# Patient Record
Sex: Female | Born: 1980 | Race: White | Hispanic: No | Smoking: Never smoker
Health system: Southern US, Community
[De-identification: ages and names within clinical notes are randomized; demographics above are authoritative.]

---

## 2020-09-18 ENCOUNTER — Encounter (HOSPITAL_BASED_OUTPATIENT_CLINIC_OR_DEPARTMENT_OTHER): Payer: Self-pay | Admitting: *Deleted

## 2020-09-18 ENCOUNTER — Other Ambulatory Visit: Payer: Self-pay

## 2020-09-18 ENCOUNTER — Inpatient Hospital Stay (HOSPITAL_BASED_OUTPATIENT_CLINIC_OR_DEPARTMENT_OTHER)
Admission: EM | Admit: 2020-09-18 | Discharge: 2020-09-21 | DRG: 193 | Disposition: A | Discharge status: Home / Self Care | Payer: Medicaid Other | Attending: Internal Medicine | Admitting: Internal Medicine

## 2020-09-18 DIAGNOSIS — Z20822 Contact with and (suspected) exposure to covid-19: Secondary | ICD-10-CM | POA: Diagnosis present

## 2020-09-18 DIAGNOSIS — T380X5A Adverse effect of glucocorticoids and synthetic analogues, initial encounter: Secondary | ICD-10-CM | POA: Diagnosis present

## 2020-09-18 DIAGNOSIS — E669 Obesity, unspecified: Secondary | ICD-10-CM | POA: Diagnosis present

## 2020-09-18 DIAGNOSIS — Z6841 Body Mass Index (BMI) 40.0 and over, adult: Secondary | ICD-10-CM

## 2020-09-18 DIAGNOSIS — D72829 Elevated white blood cell count, unspecified: Secondary | ICD-10-CM | POA: Diagnosis present

## 2020-09-18 DIAGNOSIS — J9601 Acute respiratory failure with hypoxia: Secondary | ICD-10-CM | POA: Diagnosis present

## 2020-09-18 DIAGNOSIS — J189 Pneumonia, unspecified organism: Principal | ICD-10-CM | POA: Diagnosis present

## 2020-09-18 DIAGNOSIS — Z885 Allergy status to narcotic agent status: Secondary | ICD-10-CM

## 2020-09-18 DIAGNOSIS — Z91013 Allergy to seafood: Secondary | ICD-10-CM

## 2020-09-18 NOTE — ED Triage Notes (Signed)
C/o SOB x 2 days , covid symptoms x 1 month

## 2020-09-18 NOTE — ED Provider Notes (Addendum)
TIME SEEN: 11:59 PM  CHIEF COMPLAINT: Shortness of breath  HPI: Patient is a 39 year old female with history of obesity who presents to the emergency department with shortness of breath that has been worsening over the past month.  States about a month ago she had fevers, chills, cough, nausea, vomiting and diarrhea.  She had a negative Covid test at that time.  She reports she has not had any fever, vomiting or diarrhea in 3 weeks but has had progressively worsening shortness of breath with dry cough and wheezing.  Reports chest pressure the past 3 days.  No history of tobacco use, asthma, COPD.  No history of PE or DVT.  No lower extremity swelling or pain.  Does not wear oxygen chronically.  Patient is not vaccinated for COVID-19.  ROS: See HPI Constitutional: no fever  Eyes: no drainage  ENT: no runny nose   Cardiovascular:  no chest pain  Resp:  SOB  GI: no vomiting GU: no dysuria Integumentary: no rash  Allergy: no hives  Musculoskeletal: no leg swelling  Neurological: no slurred speech ROS otherwise negative  PAST MEDICAL HISTORY/PAST SURGICAL HISTORY:  History reviewed. No pertinent past medical history.  MEDICATIONS:  Prior to Admission medications   Not on File    ALLERGIES:  Allergies  Allergen Reactions  . Shellfish Allergy     SOCIAL HISTORY:  Social History   Tobacco Use  . Smoking status: Never Smoker  . Smokeless tobacco: Not on file  Substance Use Topics  . Alcohol use: Not Currently    FAMILY HISTORY: No family history on file.  EXAM: BP (!) 165/105   Pulse 95   Temp 98.3 F (36.8 C) (Oral)   Resp 18   Ht 5' (1.524 m)   Wt 105.2 kg   LMP 08/26/2020   SpO2 96%   BMI 45.31 kg/m  CONSTITUTIONAL: Alert and oriented and responds appropriately to questions. Well-appearing; well-nourished, obese HEAD: Normocephalic EYES: Conjunctivae clear, pupils appear equal, EOM appear intact ENT: normal nose; moist mucous membranes NECK: Supple, normal  ROM CARD: Regular and intermittently tachycardic; S1 and S2 appreciated; no murmurs, no clicks, no rubs, no gallops RESP: Patient is tachypneic.  She is speaking short sentences.  She has expiratory wheezes appreciated diffusely.  No inspiratory wheezing.  No rhonchi or rales.  Minimally diminished at her bases bilaterally. ABD/GI: Normal bowel sounds; non-distended; soft, non-tender, no rebound, no guarding, no peritoneal signs, no hepatosplenomegaly BACK:  The back appears normal EXT: Normal ROM in all joints; no deformity noted, no edema; no cyanosis, no calf tenderness or calf swelling SKIN: Normal color for age and race; warm; no rash on exposed skin NEURO: Moves all extremities equally PSYCH: The patient's mood and manner are appropriate.   MEDICAL DECISION MAKING: Patient here with shortness of breath.  Oxygen saturation 86% with ambulation.  She does not wear oxygen chronically.  She is tachypneic here and wheezing.  Differential includes bronchospasm, viral URI such as Covid or influenza, pneumonia, PE.  Will obtain labs, Covid test, CTA of the chest.  Will give albuterol inhaler for symptomatic relief.  EKG shows no ischemic abnormality.  ED PROGRESS: Patient's work-up shows normal labs other than mild leukocytosis.  Normal high-sensitivity troponin and BNP.  Normal lactate.  Negative Covid and flu test today.  CT angios shows no PE but does show areas concerning for multifocal pneumonia.  Will cover with antibiotics for community-acquired pneumonia.  Given new onset hypoxia, patient will need admission.  Will  discuss with hospitalist at [facility].  2:31 AM Discussed patient's case with hospitalist, Dr. Toniann Fail.  I have recommended admission and patient (and family if present) agree with this plan. Admitting physician will place admission orders.   I reviewed all nursing notes, vitals, pertinent previous records and reviewed/interpreted all EKGs, lab and urine results, imaging (as  available).  3:45 AM  Called to room as patient stated that she was feeling very hot.  She has received Rocephin and half of her azithromycin.  No signs of allergic reaction.  She is coughing and now wheezing more than previous and is tachypneic.  Sats are 97% on 2 L.  Speaking full sentences.  Will give albuterol, Atrovent nebulizer treatment now that we know she is Covid negative.  We will also give Solu-Medrol given she is wheezing and hypoxic.  Will give Tussionex for symptomatic relief.   EKG Interpretation  Date/Time:  Tuesday September 18 2020 23:52:53 EST Ventricular Rate:  86 PR Interval:    QRS Duration: 94 QT Interval:  361 QTC Calculation: 432 R Axis:   98 Text Interpretation: Sinus rhythm Borderline right axis deviation No old tracing to compare Confirmed by Yilin Weedon, Baxter Hire [id number]) on 09/18/2020 11:59:02 PM        CRITICAL CARE Performed by: Baxter Hire Karmah Potocki   Total critical care time: 45 minutes  Critical care time was exclusive of separately billable procedures and treating other patients.  Critical care was necessary to treat or prevent imminent or life-threatening deterioration.  Critical care was time spent personally by me on the following activities: development of treatment plan with patient and/or surrogate as well as nursing, discussions with consultants, evaluation of patient's response to treatment, examination of patient, obtaining history from patient or surrogate, ordering and performing treatments and interventions, ordering and review of laboratory studies, ordering and review of radiographic studies, pulse oximetry and re-evaluation of patient's condition.   Jasiah Buntin was evaluated in Emergency Department on 09/18/2020 for the symptoms described in the history of present illness. She was evaluated in the context of the global COVID-19 pandemic, which necessitated consideration that the patient might be at risk for infection with the SARS-CoV-2 virus that  causes COVID-19. Institutional protocols and algorithms that pertain to the evaluation of patients at risk for COVID-19 are in a state of rapid change based on information released by regulatory bodies including the CDC and federal and state organizations. These policies and algorithms were followed during the patient's care in the ED.      Shalane Florendo, Layla Maw, DO 09/19/20 0231    Ensley Blas, Layla Maw, DO 09/19/20 0346    Honorio Devol, Layla Maw, DO 09/19/20 [phone]

## 2020-09-18 NOTE — ED Notes (Signed)
86% pulse ox while amb , HR 122

## 2020-09-19 ENCOUNTER — Emergency Department (HOSPITAL_BASED_OUTPATIENT_CLINIC_OR_DEPARTMENT_OTHER): Payer: Medicaid Other

## 2020-09-19 ENCOUNTER — Encounter (HOSPITAL_BASED_OUTPATIENT_CLINIC_OR_DEPARTMENT_OTHER): Payer: Self-pay | Admitting: Radiology

## 2020-09-19 DIAGNOSIS — Z885 Allergy status to narcotic agent status: Secondary | ICD-10-CM | POA: Diagnosis not present

## 2020-09-19 DIAGNOSIS — Z91013 Allergy to seafood: Secondary | ICD-10-CM | POA: Diagnosis not present

## 2020-09-19 DIAGNOSIS — Z6841 Body Mass Index (BMI) 40.0 and over, adult: Secondary | ICD-10-CM | POA: Diagnosis not present

## 2020-09-19 DIAGNOSIS — J189 Pneumonia, unspecified organism: Secondary | ICD-10-CM | POA: Diagnosis present

## 2020-09-19 DIAGNOSIS — D72829 Elevated white blood cell count, unspecified: Secondary | ICD-10-CM | POA: Diagnosis present

## 2020-09-19 DIAGNOSIS — Z20822 Contact with and (suspected) exposure to covid-19: Secondary | ICD-10-CM | POA: Diagnosis present

## 2020-09-19 DIAGNOSIS — T380X5A Adverse effect of glucocorticoids and synthetic analogues, initial encounter: Secondary | ICD-10-CM | POA: Diagnosis present

## 2020-09-19 DIAGNOSIS — J9601 Acute respiratory failure with hypoxia: Secondary | ICD-10-CM | POA: Diagnosis not present

## 2020-09-19 DIAGNOSIS — E669 Obesity, unspecified: Secondary | ICD-10-CM | POA: Diagnosis present

## 2020-09-19 LAB — CBC WITH DIFFERENTIAL/PLATELET
Abs Immature Granulocytes: 0.04 10*3/uL (ref 0.00–0.07)
Basophils Absolute: 0.1 10*3/uL (ref 0.0–0.1)
Basophils Relative: 0 %
Eosinophils Absolute: 0.6 10*3/uL — ABNORMAL HIGH (ref 0.0–0.5)
Eosinophils Relative: 5 %
HCT: 38.8 % (ref 36.0–46.0)
Hemoglobin: 13.6 g/dL (ref 12.0–15.0)
Immature Granulocytes: 0 %
Lymphocytes Relative: 31 %
Lymphs Abs: 4 10*3/uL (ref 0.7–4.0)
MCH: 30 pg (ref 26.0–34.0)
MCHC: 35.1 g/dL (ref 30.0–36.0)
MCV: 85.7 fL (ref 80.0–100.0)
Monocytes Absolute: 0.7 10*3/uL (ref 0.1–1.0)
Monocytes Relative: 6 %
Neutro Abs: 7.5 10*3/uL (ref 1.7–7.7)
Neutrophils Relative %: 58 %
Platelets: 444 10*3/uL — ABNORMAL HIGH (ref 150–400)
RBC: 4.53 MIL/uL (ref 3.87–5.11)
RDW: 14 % (ref 11.5–15.5)
WBC: 12.9 10*3/uL — ABNORMAL HIGH (ref 4.0–10.5)
nRBC: 0 % (ref 0.0–0.2)

## 2020-09-19 LAB — RESP PANEL BY RT-PCR (FLU A&B, COVID) ARPGX2
Influenza A by PCR: NEGATIVE
Influenza A by PCR: NEGATIVE
Influenza B by PCR: NEGATIVE
Influenza B by PCR: NEGATIVE
SARS Coronavirus 2 by RT PCR: NEGATIVE
SARS Coronavirus 2 by RT PCR: NEGATIVE

## 2020-09-19 LAB — COMPREHENSIVE METABOLIC PANEL
ALT: 27 U/L (ref 0–44)
AST: 21 U/L (ref 15–41)
Albumin: 4.3 g/dL (ref 3.5–5.0)
Alkaline Phosphatase: 51 U/L (ref 38–126)
Anion gap: 9 (ref 5–15)
BUN: 12 mg/dL (ref 6–20)
CO2: 24 mmol/L (ref 22–32)
Calcium: 9.4 mg/dL (ref 8.9–10.3)
Chloride: 102 mmol/L (ref 98–111)
Creatinine, Ser: 0.75 mg/dL (ref 0.44–1.00)
GFR, Estimated: >60 mL/min (ref >60)
Glucose, Bld: 92 mg/dL (ref 70–99)
Potassium: 3.9 mmol/L (ref 3.5–5.1)
Sodium: 135 mmol/L (ref 135–145)
Total Bilirubin: 0.3 mg/dL (ref 0.3–1.2)
Total Protein: 7.4 g/dL (ref 6.5–8.1)

## 2020-09-19 LAB — CBC
HCT: 36.3 % (ref 36.0–46.0)
Hemoglobin: 13.5 g/dL (ref 12.0–15.0)
MCH: 33.5 pg (ref 26.0–34.0)
MCHC: 37.2 g/dL — ABNORMAL HIGH (ref 30.0–36.0)
MCV: 90.1 fL (ref 80.0–100.0)
Platelets: 414 10*3/uL — ABNORMAL HIGH (ref 150–400)
RBC: 4.03 MIL/uL (ref 3.87–5.11)
RDW: 15.7 % — ABNORMAL HIGH (ref 11.5–15.5)
WBC: 10.5 10*3/uL (ref 4.0–10.5)
nRBC: 0 % (ref 0.0–0.2)

## 2020-09-19 LAB — HIV ANTIBODY (ROUTINE TESTING W REFLEX): HIV Screen 4th Generation wRfx: NONREACTIVE

## 2020-09-19 LAB — PREGNANCY, URINE: Preg Test, Ur: NEGATIVE

## 2020-09-19 LAB — STREP PNEUMONIAE URINARY ANTIGEN: Strep Pneumo Urinary Antigen: NEGATIVE

## 2020-09-19 LAB — TROPONIN I (HIGH SENSITIVITY): Troponin I (High Sensitivity): 4 ng/L (ref <18)

## 2020-09-19 LAB — CREATININE, SERUM
Creatinine, Ser: 0.75 mg/dL (ref 0.44–1.00)
GFR, Estimated: >60 mL/min (ref >60)

## 2020-09-19 LAB — BRAIN NATRIURETIC PEPTIDE: B Natriuretic Peptide: 24.6 pg/mL (ref 0.0–100.0)

## 2020-09-19 LAB — D-DIMER, QUANTITATIVE: D-Dimer, Quant: 0.45 ug/mL-FEU (ref 0.00–0.50)

## 2020-09-19 LAB — LACTIC ACID, PLASMA: Lactic Acid, Venous: 0.9 mmol/L (ref 0.5–1.9)

## 2020-09-19 MED ORDER — IOHEXOL 350 MG/ML SOLN
100.0000 mL | Freq: Once | INTRAVENOUS | Status: AC | PRN
  Administered 2020-09-19: 01:00:00 100 mL via INTRAVENOUS

## 2020-09-19 MED ORDER — SODIUM CHLORIDE 0.9 % IV SOLN
1.0000 g | Freq: Once | INTRAVENOUS | Status: AC
  Administered 2020-09-19: 02:00:00 1 g via INTRAVENOUS
  Filled 2020-09-19: qty 10

## 2020-09-19 MED ORDER — ONDANSETRON HCL 4 MG PO TABS: 4.0000 mg | ORAL_TABLET | Freq: Four times a day (QID) | ORAL | Status: DC | PRN

## 2020-09-19 MED ORDER — CEFTRIAXONE SODIUM 2 G IJ SOLR
2.0000 g | INTRAMUSCULAR | Status: DC
  Administered 2020-09-20 – 2020-09-21 (×2): 2 g via INTRAVENOUS
  Filled 2020-09-19: qty 2
  Filled 2020-09-19: qty 1.88

## 2020-09-19 MED ORDER — METHYLPREDNISOLONE SODIUM SUCC 125 MG IJ SOLR
60.0000 mg | Freq: Two times a day (BID) | INTRAMUSCULAR | Status: DC
  Administered 2020-09-19 – 2020-09-21 (×4): 60 mg via INTRAVENOUS
  Filled 2020-09-19 (×4): qty 2

## 2020-09-19 MED ORDER — ENOXAPARIN SODIUM 40 MG/0.4ML ~~LOC~~ SOLN: 40.0000 mg | SUBCUTANEOUS | Status: DC

## 2020-09-19 MED ORDER — SODIUM CHLORIDE 0.9 % IV SOLN
500.0000 mg | Freq: Once | INTRAVENOUS | Status: AC
  Administered 2020-09-19: 03:00:00 500 mg via INTRAVENOUS
  Filled 2020-09-19: qty 500

## 2020-09-19 MED ORDER — ALBUTEROL SULFATE (2.5 MG/3ML) 0.083% IN NEBU: 2.5000 mg | INHALATION_SOLUTION | RESPIRATORY_TRACT | Status: DC | PRN

## 2020-09-19 MED ORDER — ALBUTEROL SULFATE (2.5 MG/3ML) 0.083% IN NEBU: 5.0000 mg | INHALATION_SOLUTION | Freq: Once | RESPIRATORY_TRACT | Status: DC

## 2020-09-19 MED ORDER — SODIUM CHLORIDE 0.9 % IV SOLN
INTRAVENOUS | Status: DC | PRN
  Administered 2020-09-19: 02:00:00 1000 mL via INTRAVENOUS

## 2020-09-19 MED ORDER — IPRATROPIUM-ALBUTEROL 0.5-2.5 (3) MG/3ML IN SOLN
3.0000 mL | Freq: Four times a day (QID) | RESPIRATORY_TRACT | Status: DC
  Administered 2020-09-19 (×2): 3 mL via RESPIRATORY_TRACT
  Filled 2020-09-19 (×2): qty 3

## 2020-09-19 MED ORDER — METHYLPREDNISOLONE SODIUM SUCC 125 MG IJ SOLR
125.0000 mg | Freq: Once | INTRAMUSCULAR | Status: AC
  Administered 2020-09-19: 04:00:00 125 mg via INTRAVENOUS
  Filled 2020-09-19: qty 2

## 2020-09-19 MED ORDER — HYDROCOD POLST-CPM POLST ER 10-8 MG/5ML PO SUER
5.0000 mL | Freq: Two times a day (BID) | ORAL | Status: DC | PRN
Start: 2020-09-19 — End: 2020-09-20
  Administered 2020-09-19 (×2): 5 mL via ORAL
  Filled 2020-09-19 (×2): qty 5

## 2020-09-19 MED ORDER — AZITHROMYCIN 250 MG PO TABS
500.0000 mg | ORAL_TABLET | Freq: Every day | ORAL | Status: DC
  Administered 2020-09-20 – 2020-09-21 (×2): 500 mg via ORAL
  Filled 2020-09-19 (×3): qty 2

## 2020-09-19 MED ORDER — IPRATROPIUM-ALBUTEROL 0.5-2.5 (3) MG/3ML IN SOLN
3.0000 mL | Freq: Once | RESPIRATORY_TRACT | Status: AC
  Administered 2020-09-19: 04:00:00 3 mL via RESPIRATORY_TRACT
  Filled 2020-09-19: qty 3

## 2020-09-19 MED ORDER — ACETAMINOPHEN 500 MG PO TABS
1000.0000 mg | ORAL_TABLET | Freq: Four times a day (QID) | ORAL | Status: DC | PRN
  Administered 2020-09-19: 1000 mg via ORAL
  Filled 2020-09-19: qty 2

## 2020-09-19 MED ORDER — IPRATROPIUM BROMIDE 0.02 % IN SOLN: 0.5000 mg | Freq: Once | RESPIRATORY_TRACT | Status: DC

## 2020-09-19 MED ORDER — IPRATROPIUM-ALBUTEROL 0.5-2.5 (3) MG/3ML IN SOLN
3.0000 mL | Freq: Three times a day (TID) | RESPIRATORY_TRACT | Status: DC
  Administered 2020-09-20 – 2020-09-21 (×4): 3 mL via RESPIRATORY_TRACT
  Filled 2020-09-19 (×4): qty 3

## 2020-09-19 MED ORDER — ALBUTEROL SULFATE (2.5 MG/3ML) 0.083% IN NEBU
2.5000 mg | INHALATION_SOLUTION | Freq: Once | RESPIRATORY_TRACT | Status: AC
  Administered 2020-09-19: 04:00:00 2.5 mg via RESPIRATORY_TRACT
  Filled 2020-09-19: qty 3

## 2020-09-19 MED ORDER — ENOXAPARIN SODIUM 60 MG/0.6ML ~~LOC~~ SOLN
50.0000 mg | SUBCUTANEOUS | Status: DC
  Administered 2020-09-19 – 2020-09-20 (×2): 50 mg via SUBCUTANEOUS
  Filled 2020-09-19 (×2): qty 0.6

## 2020-09-19 MED ORDER — ALBUTEROL SULFATE HFA 108 (90 BASE) MCG/ACT IN AERS
8.0000 | INHALATION_SPRAY | Freq: Once | RESPIRATORY_TRACT | Status: AC
  Administered 2020-09-19: 8 via RESPIRATORY_TRACT
  Filled 2020-09-19: qty 6.7

## 2020-09-19 MED ORDER — METOPROLOL TARTRATE 5 MG/5ML IV SOLN
5.0000 mg | Freq: Four times a day (QID) | INTRAVENOUS | Status: DC | PRN
  Administered 2020-09-19: 17:00:00 5 mg via INTRAVENOUS
  Filled 2020-09-19: qty 5

## 2020-09-19 MED ORDER — ONDANSETRON HCL 4 MG/2ML IJ SOLN: 4.0000 mg | Freq: Four times a day (QID) | INTRAMUSCULAR | Status: DC | PRN

## 2020-09-19 NOTE — H&P (Addendum)
History and Physical    Jodi Page ENI:[id number] DOB: 11/25/1980 DOA: 09/18/2020  PCP: Patient, No Pcp Per  Patient coming from: [facility]  Chief Complaint: dyspnea  HPI: Jodi Page is a 39 y.o. female with no significant medical history significant. Presenting with dyspnea.  She reports that her symptoms seem to have started about a month ago. She had chills, fever, and just generally felt bad. Her child had been exposed to RSV in school and she thought maybe she was suffering from it was well. She was also curious as to whether or not she could have been infected with COVID. So she took to COVID home tests one week apart. They were both negative. She also tried OTC mucinex, APAP, theraflu, and sudafed for her symptoms. Her chills and fevers subsided; however, she started having a lot of congestion and chest heaviness. Yesterday she had significant non-productive cough and "heaviness" that she decided to go to the ED.   ED Course: Got nebs, steroids. Symptoms improved. COVID test was negative. CT was positive for a multifocal PNA. She was started on steroids, nebs, and CAP coverage. TRH was called for admission.   Review of Systems:  Denies CP, N, V, ab pain.  Review of systems is otherwise negative for all not mentioned in HPI.   PMHx History reviewed. No pertinent past medical history.  PSHx Past Surgical History:  Procedure Laterality Date  . CESAREAN SECTION      SocHx  reports that she has never smoked. She does not have any smokeless tobacco history on file. She reports previous alcohol use. She reports previous drug use.  Allergies  Allergen Reactions  . Percocet [Oxycodone-Acetaminophen] Other (See Comments)    Per pt: makes her use the bathroom a lot  . Shellfish Allergy     FamHx No family history on file.  Prior to Admission medications   Not on File    Physical Exam: Vitals:   09/19/20 0730 09/19/20 0800 09/19/20 0902 09/19/20 0936  BP: 126/85 138/85  109/77 (!) 150/99  Pulse: 88 95 89 98  Resp: (!) 28 (!) 23 20 18   Temp:   98.3 F (36.8 C) 98 F (36.7 C)  TempSrc:   Oral Oral  SpO2: 97% 97% 98% 97%  Weight:      Height:        General: 39 y.o. female resting in bed in NAD Eyes: PERRL, normal sclera ENMT: Nares patent w/o discharge, orophaynx clear, dentition normal, ears w/o discharge/lesions/ulcers Neck: Supple, trachea midline Cardiovascular: RRR, +S1, S2, no m/g/r, equal pulses throughout Respiratory: Exp wheeze b/l, normal WOB on RA GI: BS+, NDNT, no masses noted, no organomegaly noted MSK: No e/c/c Skin: No rashes, bruises, ulcerations noted Neuro: A&O x 3, no focal deficits Psyc: Appropriate interaction and affect, calm/cooperative  Labs on Admission: I have personally reviewed following labs and imaging studies  CBC: Recent Labs  Lab 09/19/20 0012  WBC 12.9*  NEUTROABS 7.5  HGB 13.6  HCT 38.8  MCV 85.7  PLT 444*   Basic Metabolic Panel: Recent Labs  Lab 09/19/20 0012  NA 135  K 3.9  CL 102  CO2 24  GLUCOSE 92  BUN 12  CREATININE 0.75  CALCIUM 9.4   GFR: Estimated Creatinine Clearance: 105.5 mL/min (by C-G formula based on SCr of 0.75 mg/dL). Liver Function Tests: Recent Labs  Lab 09/19/20 0012  AST 21  ALT 27  ALKPHOS 51  BILITOT 0.3  PROT 7.4  ALBUMIN 4.3  No results for input(s): LIPASE, AMYLASE in the last 168 hours. No results for input(s): AMMONIA in the last 168 hours. Coagulation Profile: No results for input(s): INR, PROTIME in the last 168 hours. Cardiac Enzymes: No results for input(s): CKTOTAL, CKMB, CKMBINDEX, TROPONINI in the last 168 hours. BNP (last 3 results) No results for input(s): PROBNP in the last 8760 hours. HbA1C: No results for input(s): HGBA1C in the last 72 hours. CBG: No results for input(s): GLUCAP in the last 168 hours. Lipid Profile: No results for input(s): CHOL, HDL, LDLCALC, TRIG, CHOLHDL, LDLDIRECT in the last 72 hours. Thyroid Function  Tests: No results for input(s): TSH, T4TOTAL, FREET4, T3FREE, THYROIDAB in the last 72 hours. Anemia Panel: No results for input(s): VITAMINB12, FOLATE, FERRITIN, TIBC, IRON, RETICCTPCT in the last 72 hours. Urine analysis: No results found for: COLORURINE, APPEARANCEUR, LABSPEC, PHURINE, GLUCOSEU, HGBUR, BILIRUBINUR, KETONESUR, PROTEINUR, UROBILINOGEN, NITRITE, LEUKOCYTESUR  Radiological Exams on Admission: CT Angio Chest PE W and/or Wo Contrast  Result Date: 09/19/2020 CLINICAL DATA:  Shortness of breath for 2 days, COVID-19 symptoms for 1 month EXAM: CT ANGIOGRAPHY CHEST WITH CONTRAST TECHNIQUE: Multidetector CT imaging of the chest was performed using the standard protocol during bolus administration of intravenous contrast. Multiplanar CT image reconstructions and MIPs were obtained to evaluate the vascular anatomy. CONTRAST:  OMNIPAQUE IOHEXOL 350 MG/ML SOLN COMPARISON:  None. FINDINGS: Cardiovascular: Satisfactory opacification the pulmonary arteries to the segmental level. No pulmonary artery filling defects are identified. Central pulmonary arteries are normal caliber. Normal heart size. No pericardial effusion. The aorta is normal caliber. No acute luminal abnormality of the imaged aorta. No periaortic stranding or hemorrhage. No major venous abnormalities. Mediastinum/Nodes: No mediastinal fluid or gas. Normal thyroid gland and thoracic inlet. No acute abnormality of the trachea or esophagus. Shotty low-attenuation mediastinal and hilar nodes, favored to be reactive. No worrisome enlarged mediastinal, hilar or axillary adenopathy. Lungs/Pleura: Diffuse airways thickening and scattered secretions. Some patchy areas of branching opacity present in the posterior right upper lobe, anterior left upper lobe, and lingula. No pneumothorax. No effusion. No suspicious pulmonary nodules or masses. Upper Abdomen: No acute abnormalities present in the visualized portions of the upper abdomen.  Musculoskeletal: No acute osseous abnormality or suspicious osseous lesion. Mild degenerative changes in the spine. No worrisome chest wall masses or lesions. Review of the MIP images confirms the above findings. IMPRESSION: 1. No evidence of pulmonary embolism. 2. Diffuse airways thickening and scattered secretions with some patchy areas of branching opacity in the posterior right upper lobe, anterior left upper lobe, and lingula, suggestive of a multifocal pneumonia including potential atypical viral etiologies such as COVID-19. 3. Shotty low-attenuation mediastinal and hilar nodes, favored to be reactive. Electronically Signed   By: Kreg Shropshire M.D.   On: 09/19/2020 01:15    EKG: Independently reviewed. Sinus, no st elevation  Assessment/Plan Multifocal PNA     - admit to inpt, telemetry     - BNP is negative, trp is negative     - currently on RA and ok     - she had received 125 mg solumedrol, nebs, and started on rocephin/zithro     - CTA PE w/ multifocal PNA     - continue current abx; change solumedrol to 60mg  BID, add duonebs q6h, add IS/flutter     - repeat COVID screen and add RVP     - she feels that her respiratory status is improving since she has been in the ED; will hold on  echo for now  DVT prophylaxis: lovenox  Code Status: FULL  Family Communication: None at bedside  Consults called: None   Status is: Inpatient  Remains inpatient appropriate because:Inpatient level of care appropriate due to severity of illness   Dispo: The patient is from: Home              Anticipated d/c is to: Home              Anticipated d/c date is: 3 days              Patient currently is not medically stable to d/c.   Teddy Spike DO [facility]  If 7PM-7AM, please contact night-coverage [email or web address]  09/19/2020, 10:29 AM

## 2020-09-19 NOTE — Progress Notes (Signed)
Pt. came in via stretcher accompanied by EMS Staff. Pt. Is alert and oriented, V/S taken and recorded. Pt. Is oriented to the unit, room and use of call bell  for assistance. Needs attended to.

## 2020-09-19 NOTE — ED Notes (Signed)
Placed patient on 2L [place] due to patients' WOB. Patient oxygen saturation 88% RR 30's with walking and speaking. Patient oxygen saturation 97% on humidified oxygen at 2L. Will conti =nue to monitor and make changes as necessary. Patient tolerated well.

## 2020-09-19 NOTE — ED Notes (Signed)
Pt states had viral illness few week ago was Covid negative. Was using exercise machine a couple days ago and could not catch breath. Started feeling worse today wrapping presents. deiced to come in.

## 2020-09-19 NOTE — Progress Notes (Signed)
Pt. Blood pressure is 199/105 was rechecked thereafter BP 199/120. Pt. Complained of headache, MD was notified, ordered for Metoprolol PRN.Tylenol given p.o. Latest BP 137/92. Pt. Monitored closely.

## 2020-09-19 NOTE — Plan of Care (Signed)
  Problem: Education: Goal: Knowledge of risk factors and measures for prevention of condition will improve Outcome: Progressing   Problem: Coping: Goal: Psychosocial and spiritual needs will be supported Outcome: Progressing   Problem: Respiratory: Goal: Will maintain a patent airway Outcome: Progressing Goal: Complications related to the disease process, condition or treatment will be avoided or minimized Outcome: Progressing   Problem: Health Behavior/Discharge Planning: Goal: Ability to manage health-related needs will improve Outcome: Progressing   

## 2020-09-20 DIAGNOSIS — J189 Pneumonia, unspecified organism: Principal | ICD-10-CM

## 2020-09-20 LAB — COMPREHENSIVE METABOLIC PANEL
ALT: 23 U/L (ref 0–44)
AST: 15 U/L (ref 15–41)
Albumin: 3.8 g/dL (ref 3.5–5.0)
Alkaline Phosphatase: 48 U/L (ref 38–126)
Anion gap: 9 (ref 5–15)
BUN: 12 mg/dL (ref 6–20)
CO2: 22 mmol/L (ref 22–32)
Calcium: 8.8 mg/dL — ABNORMAL LOW (ref 8.9–10.3)
Chloride: 107 mmol/L (ref 98–111)
Creatinine, Ser: 0.52 mg/dL (ref 0.44–1.00)
GFR, Estimated: >60 mL/min (ref >60)
Glucose, Bld: 149 mg/dL — ABNORMAL HIGH (ref 70–99)
Potassium: 4.2 mmol/L (ref 3.5–5.1)
Sodium: 138 mmol/L (ref 135–145)
Total Bilirubin: 0.4 mg/dL (ref 0.3–1.2)
Total Protein: 7 g/dL (ref 6.5–8.1)

## 2020-09-20 LAB — LEGIONELLA PNEUMOPHILA SEROGP 1 UR AG: L. pneumophila Serogp 1 Ur Ag: NEGATIVE

## 2020-09-20 LAB — CBC
HCT: 37.4 % (ref 36.0–46.0)
Hemoglobin: 12.7 g/dL (ref 12.0–15.0)
MCH: 29.6 pg (ref 26.0–34.0)
MCHC: 34 g/dL (ref 30.0–36.0)
MCV: 87.2 fL (ref 80.0–100.0)
Platelets: 368 10*3/uL (ref 150–400)
RBC: 4.29 MIL/uL (ref 3.87–5.11)
RDW: 14.6 % (ref 11.5–15.5)
WBC: 14.4 10*3/uL — ABNORMAL HIGH (ref 4.0–10.5)
nRBC: 0 % (ref 0.0–0.2)

## 2020-09-20 MED ORDER — ALBUTEROL SULFATE HFA 108 (90 BASE) MCG/ACT IN AERS: 2.0000 | INHALATION_SPRAY | Freq: Four times a day (QID) | RESPIRATORY_TRACT | 0 refills | Status: AC | PRN

## 2020-09-20 MED ORDER — GUAIFENESIN ER 600 MG PO TB12
600.0000 mg | ORAL_TABLET | Freq: Two times a day (BID) | ORAL | Status: DC
  Administered 2020-09-20 – 2020-09-21 (×3): 600 mg via ORAL
  Filled 2020-09-20 (×3): qty 1

## 2020-09-20 NOTE — Plan of Care (Signed)
°  Problem: Education: Goal: Knowledge of risk factors and measures for prevention of condition will improve Outcome: Progressing   Problem: Coping: Goal: Psychosocial and spiritual needs will be supported Outcome: Progressing   Problem: Respiratory: Goal: Will maintain a patent airway Outcome: Progressing Goal: Complications related to the disease process, condition or treatment will be avoided or minimized Outcome: Progressing   Problem: Health Behavior/Discharge Planning: Goal: Ability to manage health-related needs will improve Outcome: Progressing   Problem: Clinical Measurements: Goal: Ability to maintain clinical measurements within normal limits will improve Outcome: Progressing Goal: Will remain free from infection Outcome: Progressing Goal: Diagnostic test results will improve Outcome: Progressing

## 2020-09-20 NOTE — Progress Notes (Addendum)
[facility] PROGRESS NOTE   Jodi Page NIO:[id number] DOB: [birth year] DOA: 09/18/2020  PCP: Patient, No Pcp Per  Brief History/Interval Summary: 39 year old Caucasian female with no significant past medical history presented with complaints of shortness of breath.  Apparently has symptoms started about a month ago when her child was exposed to RSV in school.  Patient has had herself tested for COVID many times and they have always been negative.  She tried taking Mucinex and Tylenol without any relief.  Then she decided to come into the hospital.  She was noted to be wheezing.  Multifocal pneumonia was noted on CT scan.  COVID-19 test was negative.  Reason for Visit: Community-acquired pneumonia  Consultants: None  Procedures: None  Antibiotics: Anti-infectives (From admission, onward)   Start     Dose/Rate Route Frequency Ordered Stop   09/20/20 0600  azithromycin (ZITHROMAX) tablet 500 mg        500 mg Oral Daily 09/19/20 1158 09/25/20 0959   09/20/20 0600  cefTRIAXone (ROCEPHIN) 2 g in sodium chloride 0.9 % 100 mL IVPB        2 g 200 mL/hr over 30 Minutes Intravenous Every 24 hours 09/19/20 1158 09/25/20 0559   09/19/20 0200  cefTRIAXone (ROCEPHIN) 1 g in sodium chloride 0.9 % 100 mL IVPB        1 g 200 mL/hr over 30 Minutes Intravenous  Once 09/19/20 0155 09/19/20 0253   09/19/20 0200  azithromycin (ZITHROMAX) 500 mg in sodium chloride 0.9 % 250 mL IVPB        500 mg 250 mL/hr over 60 Minutes Intravenous  Once 09/19/20 0155 09/19/20 0408      Subjective/Interval History: Patient mentions that she is feeling better.  Continues to have wheezing and occasional shortness of breath.  Continues to have cough.  Denies any chest pain.    Assessment/Plan:  Multifocal pneumonia, likely community-acquired/acute respiratory failure with hypoxia Could have been triggered by viral infection.  This could be a superimposed bacterial infection.  She did have purulent  expectoration.  COVID-19 test negative x2.  Influenza was negative as well.  Urine for strep pneumo antigen is negative.   Continue with ceftriaxone and azithromycin for now.   No hypoxia noted but she is still wheezing.  Continue with nebulizer treatment and steroids.  Ambulate.  Mucinex.  Flutter valve and incentive spirometry.   Leukocytosis is likely due to steroids.  BNP was normal.  Lactic acid level was normal.  Troponin was normal.  EKG did not show any ischemic changes.  Respiratory viral pending.   CT angiogram showed prominent mediastinal and hilar lymph nodes thought to be reactive.  Patient denies any history of smoking. She was initially requiring oxygen.  Looks like she has been weaned off of it this morning.  Continue to monitor.  Obesity Estimated body mass index is 45.31 kg/m as calculated from the following:   Height as of this encounter: 5' (1.524 m).   Weight as of this encounter: 105.2 kg.   DVT Prophylaxis: Lovenox Code Status: Full code Family Communication: Discussed with the patient Disposition Plan: Mobilize.  Anticipate discharge home in improved  Status is: Inpatient  Remains inpatient appropriate because:IV treatments appropriate due to intensity of illness or inability to take PO and Inpatient level of care appropriate due to severity of illness   Dispo: The patient is from: Home              Anticipated d/c is to: Home  Anticipated d/c date is: 1 day              Patient currently is not medically stable to d/c.      Medications:  Scheduled: . azithromycin  500 mg Oral Daily  . enoxaparin (LOVENOX) injection  50 mg Subcutaneous Q24H  . ipratropium-albuterol  3 mL Nebulization TID  . methylPREDNISolone (SOLU-MEDROL) injection  60 mg Intravenous Q12H   Continuous: . sodium chloride 1,000 mL (09/19/20 0213)  . cefTRIAXone (ROCEPHIN)  IV 2 g (09/20/20 0513)   NFA:OZHYQMPRN:sodium chloride, acetaminophen, albuterol,  chlorpheniramine-HYDROcodone, metoprolol tartrate, ondansetron **OR** ondansetron (ZOFRAN) IV   Objective:  Vital Signs  Vitals:   09/19/20 1731 09/19/20 1954 09/19/20 2137 09/20/20 0441  BP: (!) 145/87  133/86 (!) 146/96  Pulse: 89  87 72  Resp: 18  20 19   Temp: 99 F (37.2 C)  98.3 F (36.8 C) 98.1 F (36.7 C)  TempSrc: Oral  Oral Oral  SpO2: 96% 94% 93% 95%  Weight:      Height:        Intake/Output Summary (Last 24 hours) at 09/20/2020 1101 Last data filed at 09/20/2020 0537 Gross per 24 hour  Intake 425.03 ml  Output --  Net 425.03 ml   Filed Weights   09/18/20 2342  Weight: 105.2 kg    General appearance: Awake alert.  In no distress Resp: Tachypneic.  No use of accessory muscles.  End expiratory wheezing heard bilaterally.  Few crackles at the bases.  No rhonchi. Cardio: S1-S2 is normal regular.  No S3-S4.  No rubs murmurs or bruit GI: Abdomen is soft.  Nontender nondistended.  Bowel sounds are present normal.  No masses organomegaly Extremities: No edema.  Full range of motion of lower extremities. Neurologic: Alert and oriented x3.  No focal neurological deficits.    Lab Results:  Data Reviewed: I have personally reviewed following labs and imaging studies  CBC: Recent Labs  Lab 09/19/20 0012 09/19/20 1135 09/20/20 0432  WBC 12.9* 10.5 14.4*  NEUTROABS 7.5  --   --   HGB 13.6 13.5 12.7  HCT 38.8 36.3 37.4  MCV 85.7 90.1 87.2  PLT 444* 414* 368    Basic Metabolic Panel: Recent Labs  Lab 09/19/20 0012 09/19/20 1135 09/20/20 0432  NA 135  --  138  K 3.9  --  4.2  CL 102  --  107  CO2 24  --  22  GLUCOSE 92  --  149*  BUN 12  --  12  CREATININE 0.75 0.75 0.52  CALCIUM 9.4  --  8.8*    GFR: Estimated Creatinine Clearance: 105.5 mL/min (by C-G formula based on SCr of 0.52 mg/dL).  Liver Function Tests: Recent Labs  Lab 09/19/20 0012 09/20/20 0432  AST 21 15  ALT 27 23  ALKPHOS 51 48  BILITOT 0.3 0.4  PROT 7.4 7.0  ALBUMIN 4.3  3.8      Recent Results (from the past 240 hour(s))  Resp Panel by RT-PCR (Flu A&B, Covid) Nasopharyngeal Swab     Status: None   Collection Time: 09/19/20 12:25 AM   Specimen: Nasopharyngeal Swab; Nasopharyngeal(NP) swabs in vial transport medium  Result Value Ref Range Status   SARS Coronavirus 2 by RT PCR NEGATIVE NEGATIVE Final    Comment: (NOTE) SARS-CoV-2 target nucleic acids are NOT DETECTED.  The SARS-CoV-2 RNA is generally detectable in upper respiratory specimens during the acute phase of infection. The lowest concentration of SARS-CoV-2 viral copies this  assay can detect is 138 copies/mL. A negative result does not preclude SARS-Cov-2 infection and should not be used as the sole basis for treatment or other patient management decisions. A negative result may occur with  improper specimen collection/handling, submission of specimen other than nasopharyngeal swab, presence of viral mutation(s) within the areas targeted by this assay, and inadequate number of viral copies(<138 copies/mL). A negative result must be combined with clinical observations, patient history, and epidemiological information. The expected result is Negative.  Fact Sheet for Patients:  BloggerCourse.com  Fact Sheet for Healthcare Providers:  SeriousBroker.it  This test is no t yet approved or cleared by the [place] FDA and  has been authorized for detection and/or diagnosis of SARS-CoV-2 by FDA under an Emergency Use Authorization (EUA). This EUA will remain  in effect (meaning this test can be used) for the duration of the COVID-19 declaration under Section 564(b)(1) of the Act, 21 U.S.C.section 360bbb-3(b)(1), unless the authorization is terminated  or revoked sooner.       Influenza A by PCR NEGATIVE NEGATIVE Final   Influenza B by PCR NEGATIVE NEGATIVE Final    Comment: (NOTE) The Xpert Xpress SARS-CoV-2/FLU/RSV plus assay is  intended as an aid in the diagnosis of influenza from Nasopharyngeal swab specimens and should not be used as a sole basis for treatment. Nasal washings and aspirates are unacceptable for Xpert Xpress SARS-CoV-2/FLU/RSV testing.  Fact Sheet for Patients: BloggerCourse.com  Fact Sheet for Healthcare Providers: SeriousBroker.it  This test is not yet approved or cleared by the [place] FDA and has been authorized for detection and/or diagnosis of SARS-CoV-2 by FDA under an Emergency Use Authorization (EUA). This EUA will remain in effect (meaning this test can be used) for the duration of the COVID-19 declaration under Section 564(b)(1) of the Act, 21 U.S.C. section 360bbb-3(b)(1), unless the authorization is terminated or revoked.  Performed at [facility], [address] [place], [place] [address]   Blood culture (routine x 2)     Status: None (Preliminary result)   Collection Time: 09/19/20 12:40 AM   Specimen: BLOOD  Result Value Ref Range Status   Specimen Description   Final    BLOOD LEFT ANTECUBITAL Performed at [facility], [address] [place], [place] [address]    Special Requests   Final    BOTTLES DRAWN AEROBIC AND ANAEROBIC Blood Culture adequate volume Performed at [facility], [address], [place], [place] [address]    Culture   Final    NO GROWTH 1 DAY Performed at [facility], [address] [place], [place] [address]    Report Status PENDING  Incomplete  Blood culture (routine x 2)     Status: None (Preliminary result)   Collection Time: 09/19/20 12:53 AM   Specimen: BLOOD  Result Value Ref Range Status   Specimen Description   Final    BLOOD RIGHT ANTECUBITAL Performed at [facility], [address] [place], [place] [address]    Special Requests   Final    BOTTLES DRAWN AEROBIC AND ANAEROBIC Blood Culture adequate volume Performed at  [facility], [address], [place], [place] [address]    Culture   Final    NO GROWTH 1 DAY Performed at [facility], [address] [place], [place] [address]    Report Status PENDING  Incomplete  Resp Panel by RT-PCR (Flu A&B,  Covid) Nasopharyngeal Swab     Status: None   Collection Time: 09/19/20 11:42 AM   Specimen: Nasopharyngeal Swab; Nasopharyngeal(NP) swabs in vial transport medium  Result Value Ref Range Status   SARS Coronavirus 2 by RT PCR NEGATIVE NEGATIVE Final    Comment: (NOTE) SARS-CoV-2 target nucleic acids are NOT DETECTED.  The SARS-CoV-2 RNA is generally detectable in upper respiratory specimens during the acute phase of infection. The lowest concentration of SARS-CoV-2 viral copies this assay can detect is 138 copies/mL. A negative result does not preclude SARS-Cov-2 infection and should not be used as the sole basis for treatment or other patient management decisions. A negative result may occur with  improper specimen collection/handling, submission of specimen other than nasopharyngeal swab, presence of viral mutation(s) within the areas targeted by this assay, and inadequate number of viral copies(<138 copies/mL). A negative result must be combined with clinical observations, patient history, and epidemiological information. The expected result is Negative.  Fact Sheet for Patients:  BloggerCourse.com  Fact Sheet for Healthcare Providers:  SeriousBroker.it  This test is no t yet approved or cleared by the [place] FDA and  has been authorized for detection and/or diagnosis of SARS-CoV-2 by FDA under an Emergency Use Authorization (EUA). This EUA will remain  in effect (meaning this test can be used) for the duration of the COVID-19 declaration under Section 564(b)(1) of the Act, 21 U.S.C.section 360bbb-3(b)(1), unless the authorization is terminated  or revoked sooner.        Influenza A by PCR NEGATIVE NEGATIVE Final   Influenza B by PCR NEGATIVE NEGATIVE Final    Comment: (NOTE) The Xpert Xpress SARS-CoV-2/FLU/RSV plus assay is intended as an aid in the diagnosis of influenza from Nasopharyngeal swab specimens and should not be used as a sole basis for treatment. Nasal washings and aspirates are unacceptable for Xpert Xpress SARS-CoV-2/FLU/RSV testing.  Fact Sheet for Patients: BloggerCourse.com  Fact Sheet for Healthcare Providers: SeriousBroker.it  This test is not yet approved or cleared by the [place] FDA and has been authorized for detection and/or diagnosis of SARS-CoV-2 by FDA under an Emergency Use Authorization (EUA). This EUA will remain in effect (meaning this test can be used) for the duration of the COVID-19 declaration under Section 564(b)(1) of the Act, 21 U.S.C. section 360bbb-3(b)(1), unless the authorization is terminated or revoked.  Performed at [facility], [address], [place], [place] [address]       Radiology Studies: CT Angio Chest PE W and/or Wo Contrast  Result Date: 09/19/2020 CLINICAL DATA:  Shortness of breath for 2 days, COVID-19 symptoms for 1 month EXAM: CT ANGIOGRAPHY CHEST WITH CONTRAST TECHNIQUE: Multidetector CT imaging of the chest was performed using the standard protocol during bolus administration of intravenous contrast. Multiplanar CT image reconstructions and MIPs were obtained to evaluate the vascular anatomy. CONTRAST:  OMNIPAQUE IOHEXOL 350 MG/ML SOLN COMPARISON:  None. FINDINGS: Cardiovascular: Satisfactory opacification the pulmonary arteries to the segmental level. No pulmonary artery filling defects are identified. Central pulmonary arteries are normal caliber. Normal heart size. No pericardial effusion. The aorta is normal caliber. No acute luminal abnormality of the imaged aorta. No periaortic stranding or  hemorrhage. No major venous abnormalities. Mediastinum/Nodes: No mediastinal fluid or gas. Normal thyroid gland and thoracic inlet. No acute abnormality of the trachea or esophagus. Shotty low-attenuation mediastinal and hilar nodes, favored to be reactive. No worrisome enlarged mediastinal, hilar or axillary adenopathy. Lungs/Pleura: Diffuse airways thickening and scattered secretions. Some patchy  areas of branching opacity present in the posterior right upper lobe, anterior left upper lobe, and lingula. No pneumothorax. No effusion. No suspicious pulmonary nodules or masses. Upper Abdomen: No acute abnormalities present in the visualized portions of the upper abdomen. Musculoskeletal: No acute osseous abnormality or suspicious osseous lesion. Mild degenerative changes in the spine. No worrisome chest wall masses or lesions. Review of the MIP images confirms the above findings. IMPRESSION: 1. No evidence of pulmonary embolism. 2. Diffuse airways thickening and scattered secretions with some patchy areas of branching opacity in the posterior right upper lobe, anterior left upper lobe, and lingula, suggestive of a multifocal pneumonia including potential atypical viral etiologies such as COVID-19. 3. Shotty low-attenuation mediastinal and hilar nodes, favored to be reactive. Electronically Signed   By: Kreg Shropshire M.D.   On: 09/19/2020 01:15       LOS: 1 day   Janessa Mickle  [facility] Pager on [email or web address]  09/20/2020, 11:01 AM

## 2020-09-20 NOTE — Progress Notes (Signed)
Pt walked in hall with this nurse.   Patient Saturations on Room Air at Rest = 99%  Patient Saturations on [employer] while Ambulating = 98%

## 2020-09-21 ENCOUNTER — Encounter (HOSPITAL_COMMUNITY): Payer: Self-pay | Admitting: Internal Medicine

## 2020-09-21 LAB — BASIC METABOLIC PANEL
Anion gap: 10 (ref 5–15)
BUN: 15 mg/dL (ref 6–20)
CO2: 22 mmol/L (ref 22–32)
Calcium: 8.8 mg/dL — ABNORMAL LOW (ref 8.9–10.3)
Chloride: 104 mmol/L (ref 98–111)
Creatinine, Ser: 0.65 mg/dL (ref 0.44–1.00)
GFR, Estimated: >60 mL/min (ref >60)
Glucose, Bld: 146 mg/dL — ABNORMAL HIGH (ref 70–99)
Potassium: 4.4 mmol/L (ref 3.5–5.1)
Sodium: 136 mmol/L (ref 135–145)

## 2020-09-21 LAB — CBC
HCT: 37 % (ref 36.0–46.0)
Hemoglobin: 12.6 g/dL (ref 12.0–15.0)
MCH: 30.4 pg (ref 26.0–34.0)
MCHC: 34.1 g/dL (ref 30.0–36.0)
MCV: 89.4 fL (ref 80.0–100.0)
Platelets: 379 10*3/uL (ref 150–400)
RBC: 4.14 MIL/uL (ref 3.87–5.11)
RDW: 15.6 % — ABNORMAL HIGH (ref 11.5–15.5)
WBC: 13.5 10*3/uL — ABNORMAL HIGH (ref 4.0–10.5)
nRBC: 0 % (ref 0.0–0.2)

## 2020-09-21 MED ORDER — CEFDINIR 300 MG PO CAPS: 300.0000 mg | ORAL_CAPSULE | Freq: Two times a day (BID) | ORAL | 0 refills | Status: AC

## 2020-09-21 MED ORDER — PREDNISONE 20 MG PO TABS: ORAL_TABLET | ORAL | 0 refills | Status: AC

## 2020-09-21 MED ORDER — AZITHROMYCIN 500 MG PO TABS: 500.0000 mg | ORAL_TABLET | Freq: Every day | ORAL | 0 refills | Status: AC

## 2020-09-21 MED ORDER — GUAIFENESIN ER 600 MG PO TB12: 600.0000 mg | ORAL_TABLET | Freq: Two times a day (BID) | ORAL | 0 refills | Status: AC

## 2020-09-21 NOTE — Plan of Care (Signed)
Patient met care plan goals

## 2020-09-21 NOTE — Discharge Summary (Signed)
[facility]  Physician Discharge Summary   Patient ID: Jodi Page MRN: [id number] DOB/AGE: September 07, 1981 38 y.o.  Admit date: 09/18/2020 Discharge date: 09/21/2020  PCP: Patient, No Pcp Per  DISCHARGE DIAGNOSES:  Multifocal pneumonia, Community-acquired Acute respiratory failure with hypoxia, resolved  RECOMMENDATIONS FOR OUTPATIENT FOLLOW UP: 1. Follow-up with PCP in 7 to 10 days    Home Health: None Equipment/Devices: None  CODE STATUS: Full code  DISCHARGE CONDITION: fair  Diet recommendation: As Before  INITIAL HISTORY: 39 year old Caucasian female with no significant past medical history presented with complaints of shortness of breath.  Apparently has symptoms started about a month ago when her child was exposed to RSV in school.  Patient has had herself tested for COVID many times and they have always been negative.  She tried taking Mucinex and Tylenol without any relief.  Then she decided to come into the hospital.  She was noted to be wheezing.  Multifocal pneumonia was noted on CT scan.  COVID-19 test was negative.    HOSPITAL COURSE:   Multifocal pneumonia, likely community-acquired/acute respiratory failure with hypoxia Could have been triggered by viral infection.  This could be a superimposed bacterial infection.  She did have purulent expectoration.  COVID-19 test negative x2.  Influenza was negative as well.  Urine for strep pneumo antigen is negative.    Patient was placed on ceftriaxone and azithromycin.  She was noted to be wheezing as well.  She was given steroids and nebulizer treatments.  She was given Mucinex.  She slowly started improving.  Cultures were negative.  She was taken off of oxygen.  Saturating normal on room air.  She will be discharged on oral antibiotics as well as steroid taper.  She will also be given a prescription for albuterol inhaler.  CT angiogram showed prominent mediastinal and hilar lymph nodes thought to be  reactive.  Patient denies any history of smoking.  Obesity Estimated body mass index is 45.31 kg/m as calculated from the following:   Height as of this encounter: 5' (1.524 m).   Weight as of this encounter: 105.2 kg.   Overall stable.  Patient feels much better this morning.  Wants to go home.  Lungs sound better with significantly less wheezing today compared to yesterday.  Okay for discharge home today.   PERTINENT LABS:  The results of significant diagnostics from this hospitalization (including imaging, microbiology, ancillary and laboratory) are listed below for reference.    Microbiology: Recent Results (from the past 240 hour(s))  Resp Panel by RT-PCR (Flu A&B, Covid) Nasopharyngeal Swab     Status: None   Collection Time: 09/19/20 12:25 AM   Specimen: Nasopharyngeal Swab; Nasopharyngeal(NP) swabs in vial transport medium  Result Value Ref Range Status   SARS Coronavirus 2 by RT PCR NEGATIVE NEGATIVE Final    Comment: (NOTE) SARS-CoV-2 target nucleic acids are NOT DETECTED.  The SARS-CoV-2 RNA is generally detectable in upper respiratory specimens during the acute phase of infection. The lowest concentration of SARS-CoV-2 viral copies this assay can detect is 138 copies/mL. A negative result does not preclude SARS-Cov-2 infection and should not be used as the sole basis for treatment or other patient management decisions. A negative result may occur with  improper specimen collection/handling, submission of specimen other than nasopharyngeal swab, presence of viral mutation(s) within the areas targeted by this assay, and inadequate number of viral copies(<138 copies/mL). A negative result must be combined with clinical observations, patient history, and epidemiological information. The expected result  is Negative.  Fact Sheet for Patients:  BloggerCourse.com  Fact Sheet for Healthcare Providers:   SeriousBroker.it  This test is no t yet approved or cleared by the [place] FDA and  has been authorized for detection and/or diagnosis of SARS-CoV-2 by FDA under an Emergency Use Authorization (EUA). This EUA will remain  in effect (meaning this test can be used) for the duration of the COVID-19 declaration under Section 564(b)(1) of the Act, 21 U.S.C.section 360bbb-3(b)(1), unless the authorization is terminated  or revoked sooner.       Influenza A by PCR NEGATIVE NEGATIVE Final   Influenza B by PCR NEGATIVE NEGATIVE Final    Comment: (NOTE) The Xpert Xpress SARS-CoV-2/FLU/RSV plus assay is intended as an aid in the diagnosis of influenza from Nasopharyngeal swab specimens and should not be used as a sole basis for treatment. Nasal washings and aspirates are unacceptable for Xpert Xpress SARS-CoV-2/FLU/RSV testing.  Fact Sheet for Patients: BloggerCourse.com  Fact Sheet for Healthcare Providers: SeriousBroker.it  This test is not yet approved or cleared by the [place] FDA and has been authorized for detection and/or diagnosis of SARS-CoV-2 by FDA under an Emergency Use Authorization (EUA). This EUA will remain in effect (meaning this test can be used) for the duration of the COVID-19 declaration under Section 564(b)(1) of the Act, 21 U.S.C. section 360bbb-3(b)(1), unless the authorization is terminated or revoked.  Performed at [facility], [address] [place], [place] [address]   Blood culture (routine x 2)     Status: None (Preliminary result)   Collection Time: 09/19/20 12:40 AM   Specimen: BLOOD  Result Value Ref Range Status   Specimen Description   Final    BLOOD LEFT ANTECUBITAL Performed at [facility], [address] [place], [place] [address]    Special Requests   Final    BOTTLES DRAWN AEROBIC AND ANAEROBIC Blood Culture adequate  volume Performed at [facility], [address] [place], [place] [address]    Culture   Final    NO GROWTH 1 DAY Performed at [facility], [address], [place], [place] [address]    Report Status PENDING  Incomplete  Blood culture (routine x 2)     Status: None (Preliminary result)   Collection Time: 09/19/20 12:53 AM   Specimen: BLOOD  Result Value Ref Range Status   Specimen Description   Final    BLOOD RIGHT ANTECUBITAL Performed at [facility], [address] [place], [place] [address]    Special Requests   Final    BOTTLES DRAWN AEROBIC AND ANAEROBIC Blood Culture adequate volume Performed at [facility], [address] [place], [place] [address]    Culture   Final    NO GROWTH 1 DAY Performed at [facility], [address] [place], [place] [address]    Report Status PENDING  Incomplete  Resp Panel by RT-PCR (Flu A&B, Covid) Nasopharyngeal Swab     Status: None   Collection Time: 09/19/20 11:42 AM   Specimen: Nasopharyngeal Swab; Nasopharyngeal(NP) swabs in vial transport medium  Result Value Ref Range Status   SARS Coronavirus 2 by RT PCR NEGATIVE NEGATIVE Final    Comment: (NOTE) SARS-CoV-2 target nucleic acids are NOT DETECTED.  The SARS-CoV-2 RNA is generally detectable in upper respiratory specimens during the acute phase of infection. The lowest concentration of SARS-CoV-2 viral copies this assay  can detect is 138 copies/mL. A negative result does not preclude SARS-Cov-2 infection and should not be used as the sole basis for treatment or other patient management decisions. A negative result may occur with  improper specimen collection/handling, submission of specimen other than nasopharyngeal swab, presence of viral mutation(s) within the areas targeted by this assay, and inadequate number of viral copies(<138 copies/mL). A negative result must be combined with clinical observations, patient history, and  epidemiological information. The expected result is Negative.  Fact Sheet for Patients:  BloggerCourse.com  Fact Sheet for Healthcare Providers:  SeriousBroker.it  This test is no t yet approved or cleared by the [place] FDA and  has been authorized for detection and/or diagnosis of SARS-CoV-2 by FDA under an Emergency Use Authorization (EUA). This EUA will remain  in effect (meaning this test can be used) for the duration of the COVID-19 declaration under Section 564(b)(1) of the Act, 21 U.S.C.section 360bbb-3(b)(1), unless the authorization is terminated  or revoked sooner.       Influenza A by PCR NEGATIVE NEGATIVE Final   Influenza B by PCR NEGATIVE NEGATIVE Final    Comment: (NOTE) The Xpert Xpress SARS-CoV-2/FLU/RSV plus assay is intended as an aid in the diagnosis of influenza from Nasopharyngeal swab specimens and should not be used as a sole basis for treatment. Nasal washings and aspirates are unacceptable for Xpert Xpress SARS-CoV-2/FLU/RSV testing.  Fact Sheet for Patients: BloggerCourse.com  Fact Sheet for Healthcare Providers: SeriousBroker.it  This test is not yet approved or cleared by the [place] FDA and has been authorized for detection and/or diagnosis of SARS-CoV-2 by FDA under an Emergency Use Authorization (EUA). This EUA will remain in effect (meaning this test can be used) for the duration of the COVID-19 declaration under Section 564(b)(1) of the Act, 21 U.S.C. section 360bbb-3(b)(1), unless the authorization is terminated or revoked.  Performed at [facility], [address] [place], [place] [address]      Labs:  COVID-19 Labs  Recent Labs    09/19/20 1140  DDIMER 0.45    Lab Results  Component Value Date   SARSCOV2NAA NEGATIVE 09/19/2020   SARSCOV2NAA NEGATIVE 09/19/2020      Basic Metabolic  Panel: Recent Labs  Lab 09/19/20 0012 09/19/20 1135 09/20/20 0432 09/21/20 0423  NA 135  --  138 136  K 3.9  --  4.2 4.4  CL 102  --  107 104  CO2 24  --  22 22  GLUCOSE 92  --  149* 146*  BUN 12  --  12 15  CREATININE 0.75 0.75 0.52 0.65  CALCIUM 9.4  --  8.8* 8.8*   Liver Function Tests: Recent Labs  Lab 09/19/20 0012 09/20/20 0432  AST 21 15  ALT 27 23  ALKPHOS 51 48  BILITOT 0.3 0.4  PROT 7.4 7.0  ALBUMIN 4.3 3.8   CBC: Recent Labs  Lab 09/19/20 0012 09/19/20 1135 09/20/20 0432 09/21/20 0423  WBC 12.9* 10.5 14.4* 13.5*  NEUTROABS 7.5  --   --   --   HGB 13.6 13.5 12.7 12.6  HCT 38.8 36.3 37.4 37.0  MCV 85.7 90.1 87.2 89.4  PLT 444* 414* 368 379   BNP: BNP (last 3 results) Recent Labs    09/19/20 0013  BNP 24.6      IMAGING STUDIES CT Angio Chest PE W and/or Wo Contrast  Result Date: 09/19/2020 CLINICAL DATA:  Shortness of breath for 2 days, COVID-19 symptoms for 1  month EXAM: CT ANGIOGRAPHY CHEST WITH CONTRAST TECHNIQUE: Multidetector CT imaging of the chest was performed using the standard protocol during bolus administration of intravenous contrast. Multiplanar CT image reconstructions and MIPs were obtained to evaluate the vascular anatomy. CONTRAST:  100mL OMNIPAQUE IOHEXOL 350 MG/ML SOLN COMPARISON:  None. FINDINGS: Cardiovascular: Satisfactory opacification the pulmonary arteries to the segmental level. No pulmonary artery filling defects are identified. Central pulmonary arteries are normal caliber. Normal heart size. No pericardial effusion. The aorta is normal caliber. No acute luminal abnormality of the imaged aorta. No periaortic stranding or hemorrhage. No major venous abnormalities. Mediastinum/Nodes: No mediastinal fluid or gas. Normal thyroid gland and thoracic inlet. No acute abnormality of the trachea or esophagus. Shotty low-attenuation mediastinal and hilar nodes, favored to be reactive. No worrisome enlarged mediastinal, hilar or  axillary adenopathy. Lungs/Pleura: Diffuse airways thickening and scattered secretions. Some patchy areas of branching opacity present in the posterior right upper lobe, anterior left upper lobe, and lingula. No pneumothorax. No effusion. No suspicious pulmonary nodules or masses. Upper Abdomen: No acute abnormalities present in the visualized portions of the upper abdomen. Musculoskeletal: No acute osseous abnormality or suspicious osseous lesion. Mild degenerative changes in the spine. No worrisome chest wall masses or lesions. Review of the MIP images confirms the above findings. IMPRESSION: 1. No evidence of pulmonary embolism. 2. Diffuse airways thickening and scattered secretions with some patchy areas of branching opacity in the posterior right upper lobe, anterior left upper lobe, and lingula, suggestive of a multifocal pneumonia including potential atypical viral etiologies such as COVID-19. 3. Shotty low-attenuation mediastinal and hilar nodes, favored to be reactive. Electronically Signed   By: Kreg ShropshirePrice  DeHay M.D.   On: 09/19/2020 01:15    DISCHARGE EXAMINATION: Vitals:   09/20/20 1445 09/20/20 2007 09/20/20 2030 09/21/20 0531  BP: (!) 165/87  (!) 146/94 132/84  Pulse: (!) 101  83 76  Resp: 20  17 16   Temp: 98.8 F (37.1 C)  98 F (36.7 C) 98.2 F (36.8 C)  TempSrc: Oral  Oral Oral  SpO2: 96% 96% 94% 98%  Weight:      Height:       General appearance: Awake alert.  In no distress Resp: Improved air entry bilaterally.  Scattered wheezing present but much less compared to yesterday.  Few crackles at the bases. Cardio: S1-S2 is normal regular.  No S3-S4.  No rubs murmurs or bruit GI: Abdomen is soft.  Nontender nondistended.  Bowel sounds are present normal.  No masses organomegaly Extremities: No edema.  Full range of motion of lower extremities. Neurologic: Alert and oriented x3.  No focal neurological deficits.    DISPOSITION: Home  Discharge Instructions    Call MD for:   difficulty breathing, headache or visual disturbances   Complete by: As directed    Call MD for:  extreme fatigue   Complete by: As directed    Call MD for:  hives   Complete by: As directed    Call MD for:  persistant dizziness or light-headedness   Complete by: As directed    Call MD for:  persistant nausea and vomiting   Complete by: As directed    Call MD for:  severe uncontrolled pain   Complete by: As directed    Call MD for:  temperature >100.4   Complete by: As directed    Diet - low sodium heart healthy   Complete by: As directed    Discharge instructions   Complete by: As directed  Please take your medications as prescribed.  Be sure to follow-up with your primary care provider within the next 7 to 10 days.  Seek attention if your symptoms were to worsen.  You were cared for by a hospitalist during your hospital stay. If you have any questions about your discharge medications or the care you received while you were in the hospital after you are discharged, you can call the unit and asked to speak with the hospitalist on call if the hospitalist that took care of you is not available. Once you are discharged, your primary care physician will handle any further medical issues. Please note that NO REFILLS for any discharge medications will be authorized once you are discharged, as it is imperative that you return to your primary care physician (or establish a relationship with a primary care physician if you do not have one) for your aftercare needs so that they can reassess your need for medications and monitor your lab values. If you do not have a primary care physician, you can call [phone] for a physician referral.   Increase activity slowly   Complete by: As directed         Allergies as of 09/21/2020      Reactions   Percocet [oxycodone-acetaminophen] Other (See Comments)   Per pt: makes her use the bathroom a lot   Shellfish Allergy       Medication List    TAKE  these medications   albuterol 108 (90 Base) MCG/ACT inhaler Commonly known as: VENTOLIN HFA Inhale 2 puffs into the lungs every 6 (six) hours as needed for wheezing or shortness of breath.   azithromycin 500 MG tablet Commonly known as: ZITHROMAX Take 1 tablet (500 mg total) by mouth daily for 3 days.   cefdinir 300 MG capsule Commonly known as: OMNICEF Take 1 capsule (300 mg total) by mouth 2 (two) times daily for 3 days.   guaiFENesin 600 MG 12 hr tablet Commonly known as: MUCINEX Take 1 tablet (600 mg total) by mouth 2 (two) times daily.   predniSONE 20 MG tablet Commonly known as: DELTASONE Take 3 tablets once daily for 3 days followed by 2 tablets once daily for 3 days followed by 1 tablet once daily for 3 days and then stop          TOTAL DISCHARGE TIME: 35 minutes  Brick Ketcher [employer] on [email or web address]  09/21/2020, 1:16 PM

## 2020-09-21 NOTE — Progress Notes (Signed)
Discharge instructions reviewed with patient and support person. All questions answered. Patient escorted to private vehicle by nurse.

## 2020-09-21 NOTE — Discharge Instructions (Signed)
Community-Acquired Pneumonia, Adult Pneumonia is a type of lung infection that causes swelling in the airways of the lungs. Mucus and fluid may also build up inside the airways. This may cause coughing and difficulty breathing. There are different types of pneumonia. One type can develop while a person is in a hospital. A different type is called community-acquired pneumonia. It develops in people who are not, and have not recently been, in the hospital or another type of health care facility. What are the causes? This condition may be caused by:  Viruses. This is the most common cause of pneumonia.  Bacteria. Community-acquired pneumonia is often caused by Streptococcus pneumoniae bacteria. These bacteria are often passed from one person to another by breathing in droplets from the cough or sneeze of an infected person.  Fungi. This is the least common cause of pneumonia. What increases the risk? The following factors may make you more likely to develop this condition:  Having a chronic disease, such as chronic obstructive pulmonary disease (COPD), asthma, congestive heart failure, cystic fibrosis, diabetes, or kidney disease.  Having early-stage or late-stage HIV.  Having sickle cell disease.  Having had your spleen removed (splenectomy).  Having poor dental hygiene.  Having a medical condition that increases the risk of breathing in (aspirating) secretions from your own mouth and nose.  Having a weakened body defense system (immune system).  Being a smoker.  Traveling to areas where pneumonia-causing germs commonly exist.  Being around animal habitats or animals that have pneumonia-causing germs, including birds, bats, rabbits, cats, and farm animals. What are the signs or symptoms? Symptoms of this condition include:  A dry cough.  A wet (productive) cough.  Fever.  Sweating.  Chest pain, especially when breathing deeply or coughing.  Rapid breathing or difficulty  breathing.  Shortness of breath.  Shaking chills.  Fatigue.  Muscle aches. How is this diagnosed? This condition may be diagnosed based on:  Your medical history.  A physical exam. You may also have tests, including:  Chest X-rays.  Tests of your blood oxygen level and other blood gases.  Tests on blood, mucus (sputum), fluid around your lungs (pleural fluid), and urine. If your pneumonia is severe, other tests may be done to find the exact cause of your illness. How is this treated? Treatment for this condition depends on many factors, such as the cause of your pneumonia, the medicines you take, and other medical conditions that you have. For most adults, treatment and recovery from pneumonia may occur at home. In some cases, treatment must happen in a hospital. Treatment may include:  Medicines that are given by mouth or through an IV, including: ? Antibiotic medicines, if the pneumonia was caused by bacteria. ? Antiviral medicines, if the pneumonia was caused by a virus.  Being given extra oxygen.  Respiratory therapy. Although rare, treating severe pneumonia may include:  Using a machine to help you breathe (mechanical ventilation). This is done if you are not breathing well on your own and you cannot maintain a safe blood oxygen level.  Thoracentesis. This is a procedure to remove fluid from around one lung or both lungs to help you breathe better. Follow these instructions at home:  Medicines  Take over-the-counter and prescription medicines only as told by your health care provider. ? Only take cough medicine if you are losing sleep. Be aware that cough medicine can prevent your body's natural ability to remove mucus from your lungs.  If you were prescribed an antibiotic   medicine, take it as told by your health care provider. Do not stop taking the antibiotic even if you start to feel better. General instructions  Sleep in a semi-upright position at night. Try  sleeping in a reclining chair, or place a few pillows under your head.  Rest as needed and get at least 8 hours of sleep each night.  Drink enough water to keep your urine pale yellow. This will help to thin out mucus secretions in your lungs.  Eat a healthy diet that includes plenty of vegetables, fruits, whole grains, low-fat dairy products, and lean protein.  Do not use any products that contain nicotine or tobacco, such as cigarettes, e-cigarettes, and chewing tobacco. If you need help quitting, ask your health care provider.  Keep all follow-up visits as told by your health care provider. This is important. How is this prevented? You can lower your risk of developing community-acquired pneumonia by:  Getting a pneumococcal vaccine. There are different types and schedules of pneumococcal vaccines. Ask your health care provider which option is best for you. Consider getting the vaccine if: ? You are older than 37 years of age. ? You are older than 37 years of age and are undergoing cancer treatment, have chronic lung disease, or have other medical conditions that affect your immune system. Ask your health care provider if this applies to you.  Getting an influenza vaccine every year. Ask your health care provider which type of vaccine is best for you.  Getting regular checkups from your dentist.  Washing your hands often. If soap and water are not available, use hand sanitizer. Contact a health care provider if:  You have a fever.  You are losing sleep because you cannot control your cough with cough medicine. Get help right away if:  You have worsening shortness of breath.  You have increased chest pain.  Your sickness becomes worse, especially if you are an older adult or have a weakened immune system.  You cough up blood. Summary  Pneumonia is an infection of the lungs.  Community-acquired pneumonia develops in people who have not been in the hospital. It can be caused  by bacteria, viruses, or fungi.  This condition may be treated with antibiotics or antiviral medicines.  Severe cases may require hospitalization, mechanical ventilation, and other procedures to drain fluid from the lungs. This information is not intended to replace advice given to you by your health care provider. Make sure you discuss any questions you have with your health care provider. Document Revised: 05/13/2018 Document Reviewed: 05/13/2018 Elsevier Patient Education  2020 [employer]  

## 2020-09-24 LAB — CULTURE, BLOOD (ROUTINE X 2)
Culture: NO GROWTH
Culture: NO GROWTH
Special Requests: ADEQUATE
Special Requests: ADEQUATE

## 2020-10-03 ENCOUNTER — Emergency Department (HOSPITAL_BASED_OUTPATIENT_CLINIC_OR_DEPARTMENT_OTHER)
Admission: EM | Admit: 2020-10-03 | Discharge: 2020-10-03 | Disposition: A | Discharge status: Home / Self Care | Payer: Medicaid Other | Attending: Emergency Medicine | Admitting: Emergency Medicine

## 2020-10-03 ENCOUNTER — Emergency Department (HOSPITAL_BASED_OUTPATIENT_CLINIC_OR_DEPARTMENT_OTHER): Payer: Medicaid Other

## 2020-10-03 ENCOUNTER — Other Ambulatory Visit: Payer: Self-pay

## 2020-10-03 DIAGNOSIS — R0602 Shortness of breath: Secondary | ICD-10-CM | POA: Diagnosis present

## 2020-10-03 DIAGNOSIS — J189 Pneumonia, unspecified organism: Secondary | ICD-10-CM | POA: Insufficient documentation

## 2020-10-03 DIAGNOSIS — Z5321 Procedure and treatment not carried out due to patient leaving prior to being seen by health care provider: Secondary | ICD-10-CM | POA: Diagnosis not present

## 2020-10-03 MED ORDER — IPRATROPIUM-ALBUTEROL 0.5-2.5 (3) MG/3ML IN SOLN
3.0000 mL | Freq: Once | RESPIRATORY_TRACT | Status: AC
  Administered 2020-10-03: 3 mL via RESPIRATORY_TRACT
  Filled 2020-10-03: qty 3

## 2020-10-03 MED ORDER — ALBUTEROL SULFATE (2.5 MG/3ML) 0.083% IN NEBU
2.5000 mg | INHALATION_SOLUTION | Freq: Once | RESPIRATORY_TRACT | Status: AC
  Administered 2020-10-03: 2.5 mg via RESPIRATORY_TRACT
  Filled 2020-10-03: qty 3

## 2020-10-03 NOTE — ED Notes (Signed)
RT to triage  

## 2020-10-03 NOTE — ED Notes (Signed)
98% with amb

## 2020-10-03 NOTE — ED Triage Notes (Addendum)
C/o SOB , DX pneumonia with admission , completed ABX , no relief with albuterol inhaler

## 2022-03-20 IMAGING — CT CT ANGIO CHEST
2 of 8 series · 18 of 36 positions shown · IV contrast (Omnipaque)
Comparison: None.

CLINICAL DATA: Shortness of breath for 2 days, 2JJLE-E7 symptoms
for 1 month

EXAM:
CT ANGIOGRAPHY CHEST WITH CONTRAST
TECHNIQUE: Multidetector CT imaging of the chest was performed using the
standard protocol during bolus administration of intravenous
contrast. Multiplanar CT image reconstructions and MIPs were
obtained to evaluate the vascular anatomy.
CONTRAST:  100mL OMNIPAQUE IOHEXOL 350 MG/ML SOLN

[Series 6: pe thins · axial · 0.73mm/px · z∈[+1125,+1345]mm · 17 of 328 slices shown]
[im 17/328  lung]
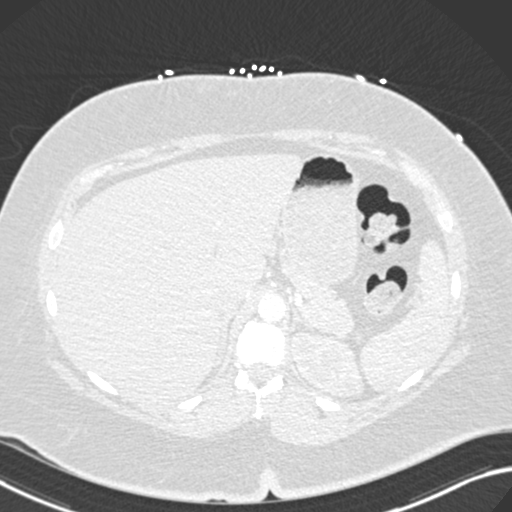
[im 33/328  mediastinal]
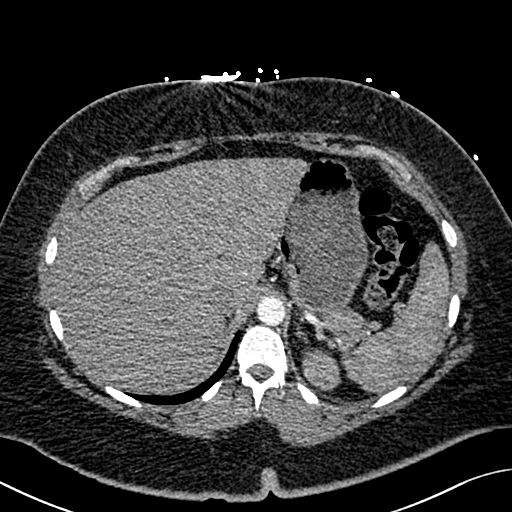
[im 50/328  lung]
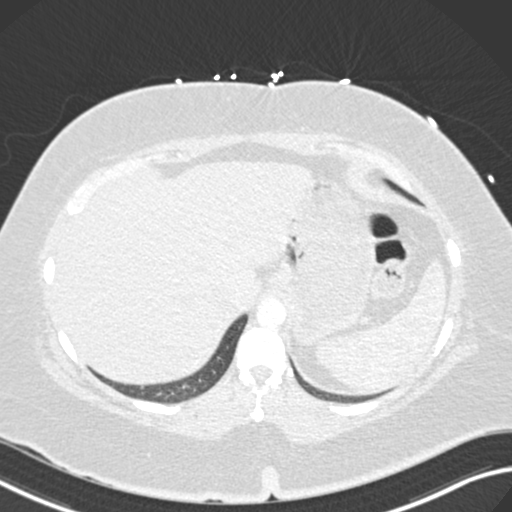
[im 66/328  mediastinal]
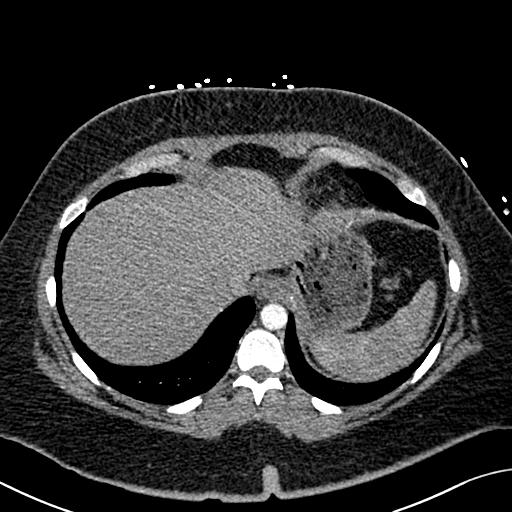
[im 99/328  lung]
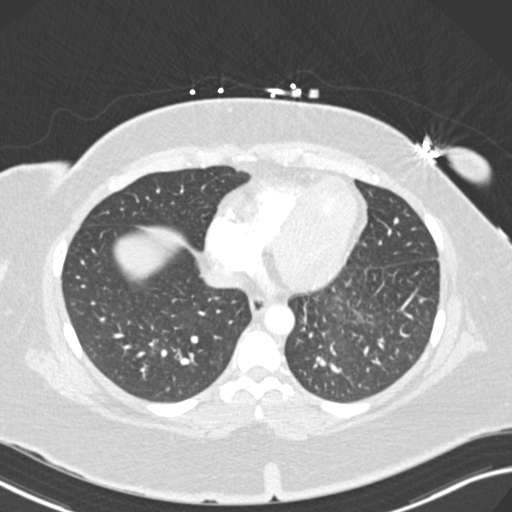
[im 115/328  mediastinal]
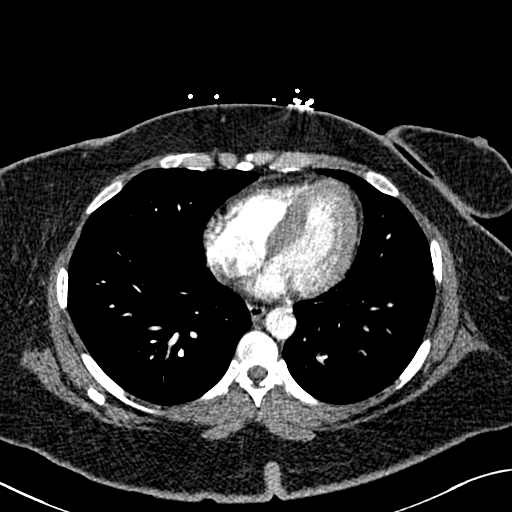
[im 131/328  lung]
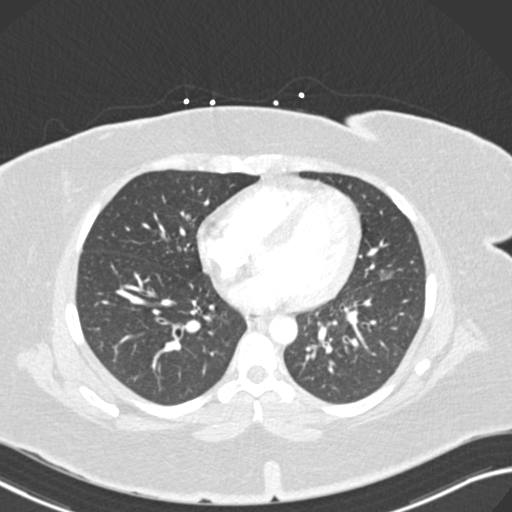
[im 148/328  mediastinal]
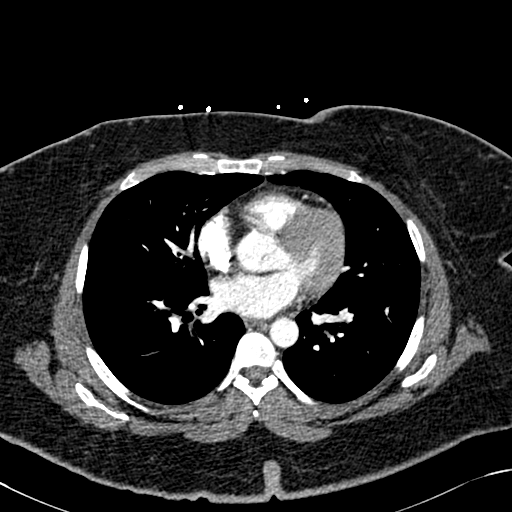
[im 164/328  lung]
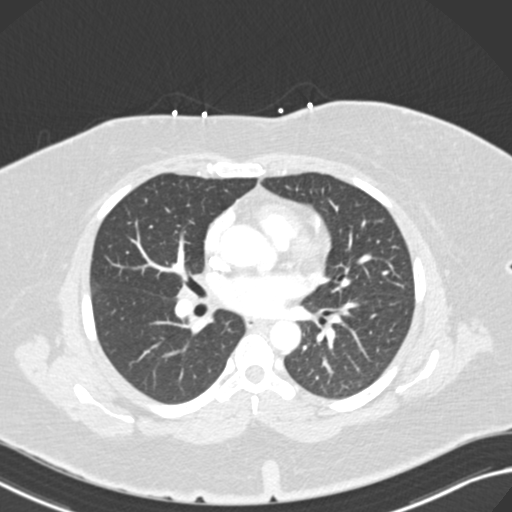
[im 180/328  mediastinal]
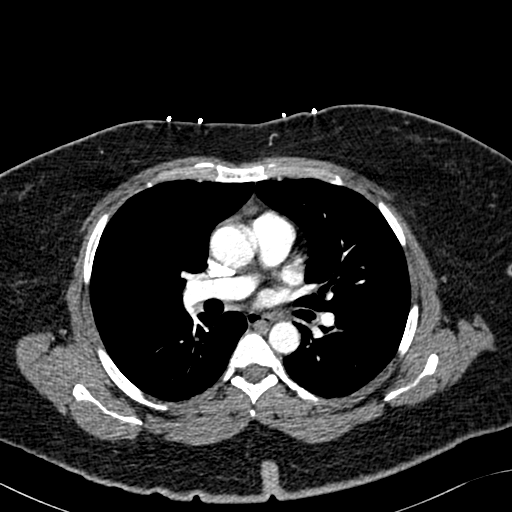
[im 197/328  lung]
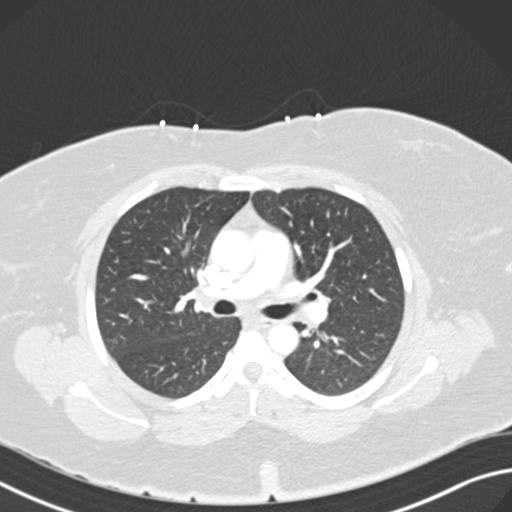
[im 213/328  mediastinal]
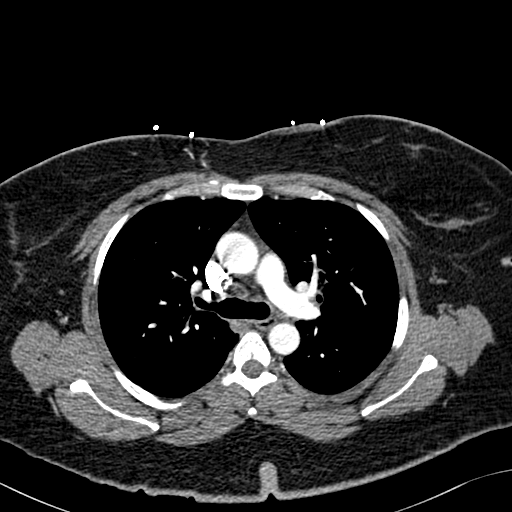
[im 229/328  lung]
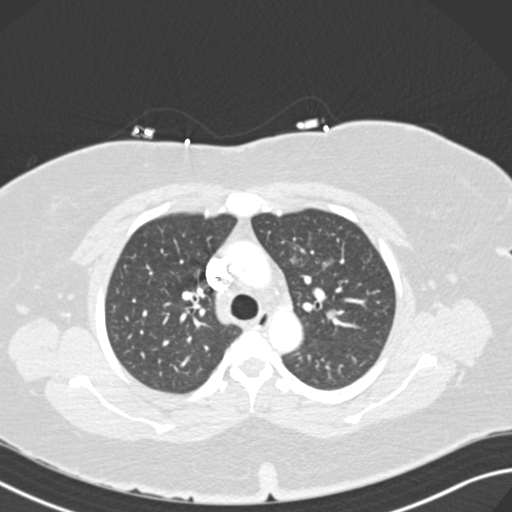
[im 262/328  mediastinal]
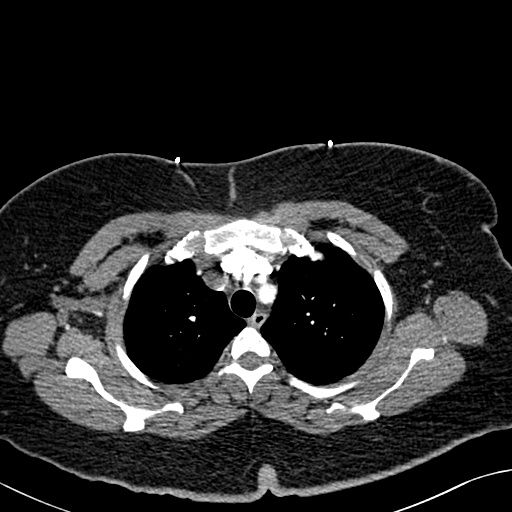
[im 278/328  lung]
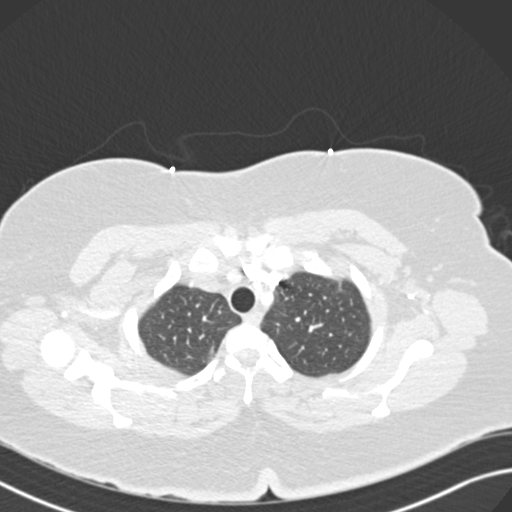
[im 295/328  mediastinal]
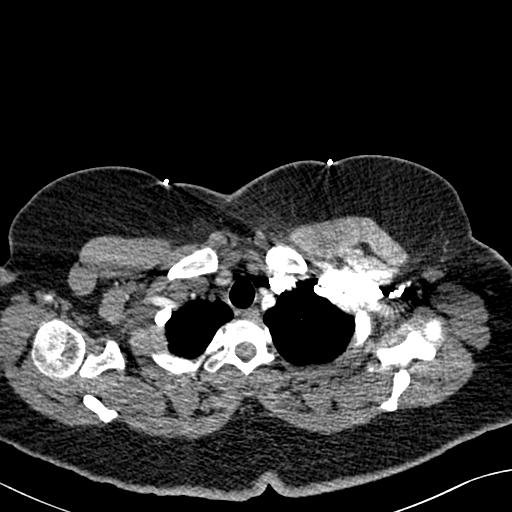
[im 311/328  lung]
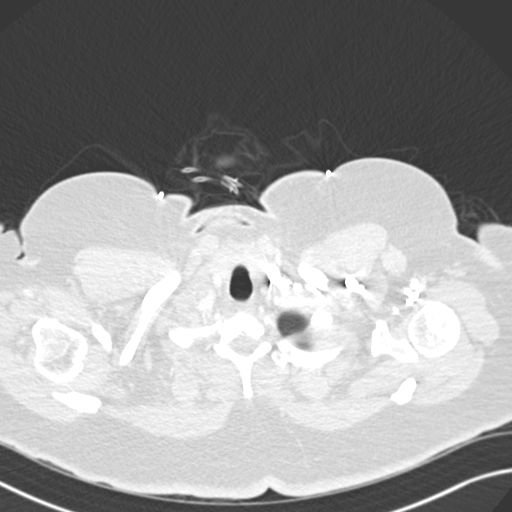

[Series 7: pe coronal mpr · coronal · 0.50mm/px · 1 of 80 slices shown]
[im 40/80  mediastinal]
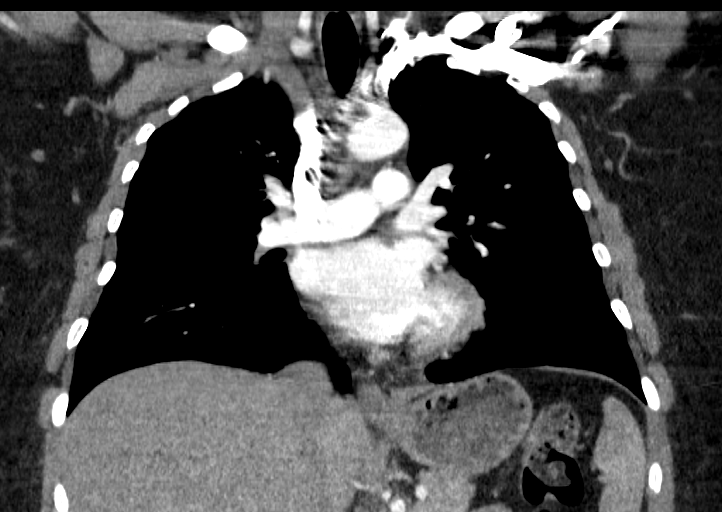

[18 of 36 positions shown; findings below may reference images not displayed]

FINDINGS: Cardiovascular: Satisfactory opacification the pulmonary arteries to
the segmental level. No pulmonary artery filling defects are
identified. Central pulmonary arteries are normal caliber. Normal
heart size. No pericardial effusion. The aorta is normal caliber. No
acute luminal abnormality of the imaged aorta. No periaortic
stranding or hemorrhage. No major venous abnormalities.

Mediastinum/Nodes: No mediastinal fluid or gas. Normal thyroid gland
and thoracic inlet. No acute abnormality of the trachea or
esophagus. Shotty low-attenuation mediastinal and hilar nodes,
favored to be reactive. No worrisome enlarged mediastinal, hilar or
axillary adenopathy.

Lungs/Pleura: Diffuse airways thickening and scattered secretions.
Some patchy areas of branching opacity present in the posterior
right upper lobe, anterior left upper lobe, and lingula. No
pneumothorax. No effusion. No suspicious pulmonary nodules or
masses.

Upper Abdomen: No acute abnormalities present in the visualized
portions of the upper abdomen.

Musculoskeletal: No acute osseous abnormality or suspicious osseous
lesion. Mild degenerative changes in the spine. No worrisome chest
wall masses or lesions.

Review of the MIP images confirms the above findings.
IMPRESSION: 1. No evidence of pulmonary embolism.
2. Diffuse airways thickening and scattered secretions with some
patchy areas of branching opacity in the posterior right upper lobe,
anterior left upper lobe, and lingula, suggestive of a multifocal
pneumonia including potential atypical viral etiologies such as
2JJLE-E7.
3. Shotty low-attenuation mediastinal and hilar nodes, favored to be
reactive.
